# Patient Record
Sex: Male | Born: 1980 | Race: White | Hispanic: No | Marital: Single | Smoking: Never smoker
Health system: Southern US, Community
[De-identification: ages and names within clinical notes are randomized; demographics above are authoritative.]

---

## 2006-02-06 ENCOUNTER — Emergency Department (HOSPITAL_COMMUNITY): Admission: EM | Admit: 2006-02-06 | Discharge: 2006-02-07 | Payer: Self-pay | Admitting: Emergency Medicine

## 2006-03-20 ENCOUNTER — Emergency Department (HOSPITAL_COMMUNITY): Admission: EM | Admit: 2006-03-20 | Discharge: 2006-03-20 | Payer: Self-pay | Admitting: Family Medicine

## 2009-01-19 ENCOUNTER — Emergency Department (HOSPITAL_COMMUNITY): Admission: EM | Admit: 2009-01-19 | Discharge: 2009-01-19 | Payer: Self-pay | Admitting: Family Medicine

## 2011-03-28 ENCOUNTER — Inpatient Hospital Stay (INDEPENDENT_AMBULATORY_CARE_PROVIDER_SITE_OTHER)
Admission: RE | Admit: 2011-03-28 | Discharge: 2011-03-28 | Disposition: A | Discharge status: Home / Self Care | Payer: Self-pay | Source: Ambulatory Visit | Attending: Emergency Medicine | Admitting: Emergency Medicine

## 2011-03-28 DIAGNOSIS — K089 Disorder of teeth and supporting structures, unspecified: Secondary | ICD-10-CM

## 2012-03-06 ENCOUNTER — Emergency Department (INDEPENDENT_AMBULATORY_CARE_PROVIDER_SITE_OTHER)
Admission: EM | Admit: 2012-03-06 | Discharge: 2012-03-06 | Disposition: A | Discharge status: Home / Self Care | Payer: Self-pay | Source: Home / Self Care | Attending: Family Medicine | Admitting: Family Medicine

## 2012-03-06 ENCOUNTER — Encounter (HOSPITAL_COMMUNITY): Payer: Self-pay | Admitting: Emergency Medicine

## 2012-03-06 DIAGNOSIS — K089 Disorder of teeth and supporting structures, unspecified: Secondary | ICD-10-CM

## 2012-03-06 DIAGNOSIS — K0889 Other specified disorders of teeth and supporting structures: Secondary | ICD-10-CM

## 2012-03-06 MED ORDER — AMOXICILLIN 500 MG PO CAPS: 500.0000 mg | ORAL_CAPSULE | Freq: Three times a day (TID) | ORAL | Status: AC

## 2012-03-06 MED ORDER — DICLOFENAC POTASSIUM 50 MG PO TABS: 50.0000 mg | ORAL_TABLET | Freq: Three times a day (TID) | ORAL | Status: AC

## 2012-03-06 NOTE — Discharge Instructions (Signed)
Call dr Matthias Hughs [phone]  about eval of irritable bowel problem, see dentist as soon as possible.

## 2012-03-06 NOTE — ED Notes (Signed)
Tooth ache onset last night.  Left, bottom tooth pain.  Tooth has been chipped for a long time, never has bothered him until last night.  Patient has stomach issues for four months.  Bouts of diarrhea and constipation.  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

## 2012-03-06 NOTE — ED Provider Notes (Signed)
History     CSN: [id number]  Arrival date & time 03/06/12  1554   First MD Initiated Contact with Patient 03/06/12 1557      Chief Complaint  Patient presents with  . Dental Pain    (Consider location/radiation/quality/duration/timing/severity/associated sxs/prior treatment) Patient is a 31 y.o. male presenting with tooth pain. The history is provided by the patient and a parent.  Dental PainThe symptoms began yesterday. The symptoms are worsening. The symptoms are new. The symptoms occur constantly.  Additional symptoms include: jaw pain.    History reviewed. No pertinent past medical history.  History reviewed. No pertinent past surgical history.  No family history on file.  History  Substance Use Topics  . Smoking status: Never Smoker   . Smokeless tobacco: Not on file  . Alcohol Use: No      Review of Systems  Constitutional: Negative.   HENT: Positive for dental problem.     Allergies  Review of patient's allergies indicates no known allergies.  Home Medications   Current Outpatient Rx  Name Route Sig Dispense Refill  . IBUPROFEN 100 MG PO TABS Oral Take 400 mg by mouth every 6 (six) hours as needed.    . AMOXICILLIN 500 MG PO CAPS Oral Take 1 capsule (500 mg total) by mouth 3 (three) times daily. 30 capsule 0  . DICLOFENAC POTASSIUM 50 MG PO TABS Oral Take 1 tablet (50 mg total) by mouth 3 (three) times daily. 20 tablet 0    BP 119/68  Pulse 70  Temp 98.1 F (36.7 C) (Oral)  Resp 14  SpO2 100%  Physical Exam  Nursing note and vitals reviewed. Constitutional: He appears well-developed and well-nourished.  HENT:  Right Ear: External ear normal.  Left Ear: External ear normal.  Mouth/Throat: Oropharynx is clear and moist and mucous membranes are normal. Abnormal dentition. Dental caries present.      ED Course  Procedures (including critical care time)  Labs Reviewed - No data to display No results found.   1. Pain, dental       MDM           Linna Hoff, MD 03/06/12 1743

## 2012-05-27 ENCOUNTER — Encounter (HOSPITAL_COMMUNITY): Payer: Self-pay | Admitting: *Deleted

## 2012-05-27 ENCOUNTER — Emergency Department (INDEPENDENT_AMBULATORY_CARE_PROVIDER_SITE_OTHER)
Admission: EM | Admit: 2012-05-27 | Discharge: 2012-05-27 | Disposition: A | Discharge status: Home / Self Care | Payer: Self-pay | Source: Home / Self Care | Attending: Emergency Medicine | Admitting: Emergency Medicine

## 2012-05-27 DIAGNOSIS — L255 Unspecified contact dermatitis due to plants, except food: Secondary | ICD-10-CM

## 2012-05-27 DIAGNOSIS — R21 Rash and other nonspecific skin eruption: Secondary | ICD-10-CM

## 2012-05-27 MED ORDER — TRIAMCINOLONE ACETONIDE 40 MG/ML IJ SUSP
60.0000 mg | Freq: Once | INTRAMUSCULAR | Status: AC
  Administered 2012-05-27: 60 mg via INTRAMUSCULAR

## 2012-05-27 MED ORDER — TRIAMCINOLONE ACETONIDE 40 MG/ML IJ SUSP
INTRAMUSCULAR | Status: AC
  Filled 2012-05-27: qty 5

## 2012-05-27 MED ORDER — TRIAMCINOLONE ACETONIDE 0.1 % EX CREA: TOPICAL_CREAM | Freq: Two times a day (BID) | CUTANEOUS | Status: AC

## 2012-05-27 MED ORDER — METHYLPREDNISOLONE 4 MG PO KIT: PACK | ORAL | Status: AC

## 2012-05-27 NOTE — ED Provider Notes (Signed)
History     CSN: [id number]  Arrival date & time 05/27/12  1121   None     Chief Complaint  Patient presents with  . Rash    (Consider location/radiation/quality/duration/timing/severity/associated sxs/prior treatment) Patient is a 31 y.o. male presenting with rash. The history is provided by the patient.  Rash   This patient complains of a pruritic rash.  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 No pertinent past medical history.  History reviewed. No pertinent past surgical history.  No family history on file.  History  Substance Use Topics  . Smoking status: Never Smoker   . Smokeless tobacco: Not on file  . Alcohol Use: No      Review of Systems  Constitutional: Negative.   Respiratory: Negative.   Cardiovascular: Negative.   Skin: Positive for rash. Negative for color change, pallor and wound.    Allergies  Review of patient's allergies indicates no known allergies.  Home Medications   Current Outpatient Rx  Name Route Sig Dispense Refill  . DICLOFENAC POTASSIUM 50 MG PO TABS Oral Take 1 tablet (50 mg total) by mouth 3 (three) times daily. 20 tablet 0  . IBUPROFEN 100 MG PO TABS Oral Take 400 mg by mouth every 6 (six) hours as needed.    . METHYLPREDNISOLONE 4 MG PO KIT  follow package directions 21 tablet 0  . TRIAMCINOLONE ACETONIDE 0.1 % EX CREA Topical Apply topically 2 (two) times daily. 30 g 0    BP 124/74  Pulse 60  Temp 97.8 F (36.6 C) (Oral)  Resp 18  SpO2 100%  Physical  Exam  Nursing note and vitals reviewed. Constitutional: He is oriented to person, place, and time. Vital signs are normal. He appears well-developed and well-nourished. He is active and cooperative.  HENT:  Head: Normocephalic.  Eyes: Conjunctivae are normal. Pupils are equal, round, and reactive to light. No scleral icterus.  Neck: Trachea normal. Neck supple.  Cardiovascular: Normal rate and regular rhythm.   Pulmonary/Chest: Effort normal and breath sounds normal.  Neurological: He is alert and oriented to person, place, and time. No cranial nerve deficit or sensory deficit.  Skin: Skin is warm and dry. Rash noted. Rash is papular and pustular.     Psychiatric: He has a normal mood and affect. His speech is normal and behavior is normal. Judgment and thought content normal. Cognition and memory are normal.    ED Course  Procedures (including critical care time)  Labs Reviewed - No data to display No results found.   1. Contact dermatitis due to plant   2. Rash       MDM  Cool showers; avoid heat, sunlight and anything that makes condition worse.  Begin for at least seven days.  Begin Medrol dosepak tomorrow-follow instructions.  RTC if symptoms do not improve or begin to have problems swallowing, breathing or significant change in condition.  Triamcinolone for all areas excluding face.          Johnsie Kindred, NP 05/27/12 1343

## 2012-05-27 NOTE — ED Notes (Signed)
Pt  Works  As  Research scientist (life sciences)    -  He  Has  Had    Contact  Dermatitis  In past  He  Developed  A  Rash  Over the  Weekend    Neck  And  Arms  / chest    -  The  Rash  Does  Itch     - he  Is  In no  Severe   Distress        He  Displays  No  Angioedema

## 2012-05-29 NOTE — ED Provider Notes (Signed)
Medical screening examination/treatment/procedure(s) were performed by non-physician practitioner and as supervising physician I was immediately available for consultation/collaboration.  Delson Dulworth M. MD   Jestina Stephani M Burlene Montecalvo, MD 05/29/12 0814 

## 2013-02-25 ENCOUNTER — Emergency Department (INDEPENDENT_AMBULATORY_CARE_PROVIDER_SITE_OTHER)
Admission: EM | Admit: 2013-02-25 | Discharge: 2013-02-25 | Disposition: A | Discharge status: Home / Self Care | Payer: Self-pay | Source: Home / Self Care

## 2013-02-25 ENCOUNTER — Encounter (HOSPITAL_COMMUNITY): Payer: Self-pay | Admitting: Emergency Medicine

## 2013-02-25 DIAGNOSIS — D229 Melanocytic nevi, unspecified: Secondary | ICD-10-CM

## 2013-02-25 DIAGNOSIS — L237 Allergic contact dermatitis due to plants, except food: Secondary | ICD-10-CM

## 2013-02-25 DIAGNOSIS — D239 Other benign neoplasm of skin, unspecified: Secondary | ICD-10-CM

## 2013-02-25 DIAGNOSIS — L255 Unspecified contact dermatitis due to plants, except food: Secondary | ICD-10-CM

## 2013-02-25 MED ORDER — PREDNISONE 10 MG PO KIT: PACK | ORAL | Status: DC

## 2013-02-25 MED ORDER — TRIAMCINOLONE ACETONIDE 0.1 % EX CREA: TOPICAL_CREAM | Freq: Two times a day (BID) | CUTANEOUS | Status: DC

## 2013-02-25 MED ORDER — CETIRIZINE HCL 10 MG PO TABS: 10.0000 mg | ORAL_TABLET | Freq: Every day | ORAL | Status: DC

## 2013-02-25 NOTE — ED Provider Notes (Signed)
History     CSN: [id number]  Arrival date & time 02/25/13  1058   None     Chief Complaint  Patient presents with  . Poison Ivy  . Nevus    (Consider location/radiation/quality/duration/timing/severity/associated sxs/prior treatment) HPI Comments: Patient presents complaining of red, itchy rash on his arms and legs past 3 days, getting continually worse. 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 Since then the itching and the rash are both getting worse. He denies any other symptoms apart from the rash. There is no pain or discharge.  Also, the patient has a wart on the middle of his upper back that has grown painful and irritating. He states he can feel it when he sits back in a chair and he would like to have it removed. He denies any recent changes in the wart and he has no history of skin cancer   Patient is a 32 y.o. male presenting with poison ivy.  Poison Ivy Pertinent negatives include no chest pain, no abdominal pain and no shortness of breath.    History reviewed. No pertinent past medical history.  History reviewed. No pertinent past surgical history.  History reviewed. No pertinent family history.  History  Substance Use Topics  . Smoking status: Never Smoker   . Smokeless tobacco: Not on file  . Alcohol Use: No      Review of Systems  Constitutional: Negative for fever, chills and fatigue.  HENT: Negative for sore throat, neck pain and neck stiffness.   Eyes: Negative for visual disturbance.  Respiratory: Negative for cough and shortness of breath.   Cardiovascular: Negative for chest pain, palpitations and leg swelling.  Gastrointestinal: Negative for nausea, vomiting, abdominal pain, diarrhea and constipation.  Genitourinary: Negative for dysuria, urgency, frequency and hematuria.  Musculoskeletal: Negative for myalgias and arthralgias.  Skin: Positive for rash.       Wart - see HPI  Neurological: Negative for dizziness, weakness and  light-headedness.    Allergies  Review of patient's allergies indicates no known allergies.  Home Medications   Current Outpatient Rx  Name  Route  Sig  Dispense  Refill  . cetirizine (ZYRTEC) 10 MG tablet   Oral   Take 1 tablet (10 mg total) by mouth daily.   30 tablet   0   . diclofenac (CATAFLAM) 50 MG tablet   Oral   Take 1 tablet (50 mg total) by mouth 3 (three) times daily.   20 tablet   0   . ibuprofen (ADVIL,MOTRIN) 100 MG tablet   Oral   Take 400 mg by mouth every 6 (six) hours as needed.         . PredniSONE 10 MG KIT      12 day taper dose pack, use as directed   1 kit   0   . triamcinolone cream (KENALOG) 0.1 %   Topical   Apply topically 2 (two) times daily.   30 g   0   . triamcinolone cream (KENALOG) 0.1 %   Topical   Apply topically 2 (two) times daily.   85 g   0     BP 117/63  Pulse 59  Temp(Src) 98.3 F (36.8 C) (Oral)  Resp 16  SpO2 100%  Physical Exam  Constitutional: He is oriented to person, place, and time. He appears well-developed and well-nourished. No distress.  HENT:  Head: Normocephalic and atraumatic.  Eyes: EOM are normal. Pupils are equal, round, and reactive to  light.  Pulmonary/Chest: He has no wheezes.  Abdominal: Soft. There is no tenderness.  Neurological: He is oriented to person, place, and time.  Skin: Skin is warm and dry. Lesion (3 mm soft, raised, dome shaped nevus on the upper middle back without any surrounding erythema or induration.) and rash (erythematous, maculopapular rash in a linear distribution on the arms and legs) noted. Rash is maculopapular.  Psychiatric: He has a normal mood and affect. Judgment normal.    ED Course  Procedures (including critical care time)  Labs Reviewed - No data to display No results found.   1. Contact dermatitis due to poison oak   2. Nevus       MDM  We'll treat the rash with oral corticosteroids and triamcinolone cream. Also daily 10 mg  Zyrtec.  Patient referred to dermatology or back to primary care physician for management of the wart on his back. He does not currently have a primary care physician so he was given information for the adult care clinic.    Meds ordered this encounter  Medications  . PredniSONE 10 MG KIT    Sig: 12 day taper dose pack, use as directed    Dispense:  1 kit    Refill:  0  . triamcinolone cream (KENALOG) 0.1 %    Sig: Apply topically 2 (two) times daily.    Dispense:  85 g    Refill:  0  . cetirizine (ZYRTEC) 10 MG tablet    Sig: Take 1 tablet (10 mg total) by mouth daily.    Dispense:  30 tablet    Refill:  0          Graylon Good, PA-C 02/25/13 2134

## 2013-02-25 NOTE — ED Notes (Signed)
Pt c/o poison oak on both legs x 1 week. 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 Very itchy. Pt also c/o mole on back that is raised and irregular. Has been itchy and painful. Patient is alert and oriented.

## 2013-02-26 NOTE — ED Provider Notes (Signed)
Medical screening examination/treatment/procedure(s) were performed by non-physician practitioner and as supervising physician I was immediately available for consultation/collaboration.  Hani Patnode   Rhemi Balbach, MD 02/26/13 0927 

## 2013-10-14 ENCOUNTER — Emergency Department (HOSPITAL_COMMUNITY)
Admission: EM | Admit: 2013-10-14 | Discharge: 2013-10-14 | Disposition: A | Discharge status: Home / Self Care | Payer: Self-pay | Attending: Emergency Medicine | Admitting: Emergency Medicine

## 2013-10-14 ENCOUNTER — Encounter (HOSPITAL_COMMUNITY): Payer: Self-pay | Admitting: Emergency Medicine

## 2013-10-14 DIAGNOSIS — IMO0002 Reserved for concepts with insufficient information to code with codable children: Secondary | ICD-10-CM | POA: Insufficient documentation

## 2013-10-14 DIAGNOSIS — K625 Hemorrhage of anus and rectum: Secondary | ICD-10-CM | POA: Insufficient documentation

## 2013-10-14 DIAGNOSIS — Z79899 Other long term (current) drug therapy: Secondary | ICD-10-CM | POA: Insufficient documentation

## 2013-10-14 DIAGNOSIS — K6289 Other specified diseases of anus and rectum: Secondary | ICD-10-CM | POA: Insufficient documentation

## 2013-10-14 MED ORDER — HYDROCORTISONE ACETATE 25 MG RE SUPP: 25.0000 mg | Freq: Two times a day (BID) | RECTAL | Status: AC

## 2013-10-14 MED ORDER — SENNOSIDES-DOCUSATE SODIUM 8.6-50 MG PO TABS: 2.0000 | ORAL_TABLET | Freq: Every day | ORAL | Status: AC

## 2013-10-14 NOTE — ED Notes (Signed)
Pt reports constipation x7-10 days, staining with BM, pt states he noticed some blood with in his stool 2 days ago and this am he did not have any blood with in his stool. Pt states increase pain with ambulating. Pt states his last normal BM was 10 days ago. Pt reports a symptoms of IBS but never diagnosed.

## 2013-10-14 NOTE — ED Notes (Signed)
Patient is alert and orientedx4.  Patient was explained discharge instructions and they understood them with no questions.   

## 2013-10-14 NOTE — Discharge Instructions (Signed)
As discussed, your physical exam and vital signs are reassuring for the low suspicion of your condition, and from acute disease.  However, if her symptoms do not improve with the provided prescription, it is very important that you follow up with our colleague for further evaluation and management.  Return here for concerning changes in your condition.

## 2013-10-14 NOTE — ED Provider Notes (Signed)
CSN: [id number]     Arrival date & time 10/14/13  1008 History   First MD Initiated Contact with Patient 10/14/13 1113     Chief Complaint  Patient presents with  . Constipation    HPI  Patient presents with concern of rectal pain, bloody stool. Patient notes the symptoms began approximately 6 months ago, he has not seen a physician nor tried any medication for relief. That time he has had intermittent episodes of rectal pain, and recently had several episodes of bloody stool. There is no rectal bleeding when not defecating, no significant abdominal pain, no lightheadedness, no syncope.  Patient does not smoke, does not drink, has no history of IBS He has not had any rectal bleeding in 2 days.  History reviewed. No pertinent past medical history. History reviewed. No pertinent past surgical history. History reviewed. No pertinent family history. History  Substance Use Topics  . Smoking status: Never Smoker   . Smokeless tobacco: Not on file  . Alcohol Use: No    Review of Systems  Constitutional:       Per HPI, otherwise negative  HENT:       Per HPI, otherwise negative  Respiratory:       Per HPI, otherwise negative  Cardiovascular:       Per HPI, otherwise negative  Gastrointestinal: Positive for abdominal pain, constipation, blood in stool and anal bleeding. Negative for vomiting and diarrhea.  Endocrine:       Negative aside from HPI  Genitourinary:       Neg aside from HPI   Musculoskeletal:       Per HPI, otherwise negative  Skin: Negative.   Neurological: Negative for syncope and weakness.    Allergies  Review of patient's allergies indicates no known allergies.  Home Medications   Current Outpatient Rx  Name  Route  Sig  Dispense  Refill  . hydrocortisone (ANUSOL-HC) 25 MG suppository   Rectal   Place 1 suppository (25 mg total) rectally 2 (two) times daily.   12 suppository   0   . senna-docusate (SENOKOT-S) 8.6-50 MG per tablet   Oral   Take 2  tablets by mouth daily.   20 tablet   0    BP 129/72  Pulse 100  Temp(Src) 98.4 F (36.9 C) (Oral)  Resp 18  SpO2 98% Physical Exam  Nursing note and vitals reviewed. Constitutional: He is oriented to person, place, and time. He appears well-developed. No distress.  HENT:  Head: Normocephalic and atraumatic.  Eyes: Conjunctivae and EOM are normal.  Cardiovascular: Normal rate and regular rhythm.   Pulmonary/Chest: Effort normal. No stridor. No respiratory distress.  Abdominal: He exhibits no distension.  Genitourinary:  No gross rectal blood, no external hemorrhoids, no fissure, no tenderness to palpation about the external rectum  Musculoskeletal: He exhibits no edema.  Neurological: He is alert and oriented to person, place, and time.  Skin: Skin is warm and dry.  Psychiatric: He has a normal mood and affect.    ED Course  Procedures (including critical care time) Labs Review Labs Reviewed - No data to display Imaging Review No results found.  EKG Interpretation   None       MDM   1. Proctalgia    This young male presents with months of bowel habit changes, including rectal pain, rectal bleeding. Patient's vital signs are stable, he is in no distress, there is low suspicion for acute systemic pathology.  After a lengthy conversation  with the patient about medical management, specialist followup, he was appropriate for discharge.    Carmin Muskrat, MD 10/14/13 1243

## 2013-12-21 ENCOUNTER — Emergency Department (INDEPENDENT_AMBULATORY_CARE_PROVIDER_SITE_OTHER)
Admission: EM | Admit: 2013-12-21 | Discharge: 2013-12-21 | Disposition: A | Discharge status: Home / Self Care | Payer: Self-pay | Source: Home / Self Care | Attending: Emergency Medicine | Admitting: Emergency Medicine

## 2013-12-21 ENCOUNTER — Encounter (HOSPITAL_COMMUNITY): Payer: Self-pay | Admitting: Emergency Medicine

## 2013-12-21 DIAGNOSIS — J039 Acute tonsillitis, unspecified: Secondary | ICD-10-CM

## 2013-12-21 LAB — POCT RAPID STREP A: Streptococcus, Group A Screen (Direct): NEGATIVE

## 2013-12-21 MED ORDER — PENICILLIN G BENZATHINE 1200000 UNIT/2ML IM SUSP
1.2000 10*6.[IU] | Freq: Once | INTRAMUSCULAR | Status: AC
  Administered 2013-12-21: 1.2 10*6.[IU] via INTRAMUSCULAR

## 2013-12-21 MED ORDER — HYDROCODONE-ACETAMINOPHEN 7.5-500 MG/15ML PO SOLN: 15.0000 mL | Freq: Three times a day (TID) | ORAL | Status: AC | PRN

## 2013-12-21 MED ORDER — PENICILLIN G BENZATHINE 1200000 UNIT/2ML IM SUSP
INTRAMUSCULAR | Status: AC
  Filled 2013-12-21: qty 2

## 2013-12-21 NOTE — ED Notes (Signed)
At 5 min mark POCT strep test was negative.  Test was viewed again 5 minutes later and was positive.  PA notified and patient treated in department.  POCT testing will be notified of discrepancy.

## 2013-12-21 NOTE — ED Provider Notes (Signed)
CSN: [id number]     Arrival date & time 12/21/13  1807 History   First MD Initiated Contact with Patient 12/21/13 1843     Chief Complaint  Patient presents with  . Sore Throat   (Consider location/radiation/quality/duration/timing/severity/associated sxs/prior Treatment) HPI Comments: Denies fever.  Patient is a 33 y.o. male presenting with pharyngitis.  Sore Throat This is a new problem. The current episode started 2 days ago. The problem occurs constantly. The problem has been gradually worsening. The symptoms are aggravated by swallowing.    History reviewed. No pertinent past medical history. History reviewed. No pertinent past surgical history. History reviewed. No pertinent family history. History  Substance Use Topics  . Smoking status: Never Smoker   . Smokeless tobacco: Not on file  . Alcohol Use: No    Review of Systems  All other systems reviewed and are negative.    Allergies  Review of patient's allergies indicates no known allergies.  Home Medications   Current Outpatient Rx  Name  Route  Sig  Dispense  Refill  . HYDROcodone-acetaminophen (LORTAB) 7.5-500 MG/15ML solution   Oral   Take 15 mLs by mouth every 8 (eight) hours as needed for pain.   120 mL   0   . hydrocortisone (ANUSOL-HC) 25 MG suppository   Rectal   Place 1 suppository (25 mg total) rectally 2 (two) times daily.   12 suppository   0   . senna-docusate (SENOKOT-S) 8.6-50 MG per tablet   Oral   Take 2 tablets by mouth daily.   20 tablet   0    BP 130/80  Pulse 105  Temp(Src) 98.6 F (37 C) (Oral)  Resp 19  SpO2 97% Physical Exam  Nursing note and vitals reviewed. Constitutional: He is oriented to person, place, and time. He appears well-developed and well-nourished. No distress.  HENT:  Head: Normocephalic and atraumatic.  Right Ear: Hearing, tympanic membrane, external ear and ear canal normal.  Left Ear: Hearing, tympanic membrane, external ear and ear canal normal.   Nose: Nose normal.  Mouth/Throat: Uvula is midline and mucous membranes are normal. No oral lesions. No trismus in the jaw. No uvula swelling. Oropharyngeal exudate, posterior oropharyngeal edema and posterior oropharyngeal erythema present. No tonsillar abscesses.  Moderate bilateral exudative tonsillitis  Eyes: Conjunctivae are normal. Right eye exhibits no discharge. Left eye exhibits no discharge. No scleral icterus.  Neck: Normal range of motion. Neck supple.  Cardiovascular: Normal rate, regular rhythm and normal heart sounds.   Pulmonary/Chest: Effort normal and breath sounds normal. No stridor. No respiratory distress. He has no wheezes.  Musculoskeletal: Normal range of motion.  Lymphadenopathy:    He has no cervical adenopathy.  Neurological: He is alert and oriented to person, place, and time.  Skin: Skin is warm and dry. No rash noted.  Psychiatric: He has a normal mood and affect. His behavior is normal.    ED Course  Procedures (including critical care time) Labs Review Labs Reviewed  POCT RAPID STREP A ([place])   Imaging Review No results found.   MDM   1. Tonsillitis   Rapid strep positive. Patient treated with Bicillin LA 1.2 MU at [facility]. Prescribed Lortab elixir for pain. Follow up in no improvement over next 48 hours.     [place], [place] 12/21/13 2013

## 2013-12-21 NOTE — ED Notes (Signed)
Pt  Reports  Symptoms  Of  sorethroat  Pain  When  He  swalows  As   Well  As  Swollen glands  With the  Onset of  Symptoms  Being    2  Days

## 2013-12-21 NOTE — ED Provider Notes (Signed)
Medical screening examination/treatment/procedure(s) were performed by non-physician practitioner and as supervising physician I was immediately available for consultation/collaboration.  Philipp Deputy, M.D.  Harden Mo, MD 12/21/13 2121

## 2013-12-22 MED ORDER — HYDROCODONE-ACETAMINOPHEN 5-325 MG PO TABS: ORAL_TABLET | ORAL | Status: AC

## 2013-12-23 LAB — CULTURE, GROUP A STREP

## 2014-12-25 ENCOUNTER — Emergency Department (HOSPITAL_COMMUNITY): Payer: Self-pay

## 2014-12-25 ENCOUNTER — Encounter (HOSPITAL_COMMUNITY): Payer: Self-pay | Admitting: Emergency Medicine

## 2014-12-25 ENCOUNTER — Emergency Department (HOSPITAL_COMMUNITY)
Admission: EM | Admit: 2014-12-25 | Discharge: 2014-12-25 | Disposition: A | Discharge status: Home / Self Care | Payer: Self-pay | Attending: Emergency Medicine | Admitting: Emergency Medicine

## 2014-12-25 DIAGNOSIS — S00411A Abrasion of right ear, initial encounter: Secondary | ICD-10-CM | POA: Insufficient documentation

## 2014-12-25 DIAGNOSIS — W01198A Fall on same level from slipping, tripping and stumbling with subsequent striking against other object, initial encounter: Secondary | ICD-10-CM | POA: Insufficient documentation

## 2014-12-25 DIAGNOSIS — S93402A Sprain of unspecified ligament of left ankle, initial encounter: Secondary | ICD-10-CM | POA: Insufficient documentation

## 2014-12-25 DIAGNOSIS — S8011XA Contusion of right lower leg, initial encounter: Secondary | ICD-10-CM | POA: Insufficient documentation

## 2014-12-25 DIAGNOSIS — S93401A Sprain of unspecified ligament of right ankle, initial encounter: Secondary | ICD-10-CM

## 2014-12-25 DIAGNOSIS — Y9301 Activity, walking, marching and hiking: Secondary | ICD-10-CM | POA: Insufficient documentation

## 2014-12-25 DIAGNOSIS — Y92007 Garden or yard of unspecified non-institutional (private) residence as the place of occurrence of the external cause: Secondary | ICD-10-CM | POA: Insufficient documentation

## 2014-12-25 DIAGNOSIS — Y998 Other external cause status: Secondary | ICD-10-CM | POA: Insufficient documentation

## 2014-12-25 MED ORDER — NAPROXEN 500 MG PO TABS: 500.0000 mg | ORAL_TABLET | Freq: Two times a day (BID) | ORAL | Status: AC

## 2014-12-25 MED ORDER — TRAMADOL HCL 50 MG PO TABS: 50.0000 mg | ORAL_TABLET | Freq: Four times a day (QID) | ORAL | Status: AC | PRN

## 2014-12-25 NOTE — Discharge Instructions (Signed)
Ice and elevate your legs. Naprosyn for pain and inflammation. Tramadol for severe pain. ASO brace for 1-2 wks. Follow up with your doctor for recheck.    Ankle Sprain An ankle sprain is an injury to the strong, fibrous tissues (ligaments) that hold the bones of your ankle joint together.  CAUSES An ankle sprain is usually caused by a fall or by twisting your ankle. Ankle sprains most commonly occur when you step on the outer edge of your foot, and your ankle turns inward. People who participate in sports are more prone to these types of injuries.  SYMPTOMS   Pain in your ankle. The pain may be present at rest or only when you are trying to stand or walk.  Swelling.  Bruising. Bruising may develop immediately or within 1 to 2 days after your injury.  Difficulty standing or walking, particularly when turning corners or changing directions. DIAGNOSIS  Your caregiver will ask you details about your injury and perform a physical exam of your ankle to determine if you have an ankle sprain. During the physical exam, your caregiver will press on and apply pressure to specific areas of your foot and ankle. Your caregiver will try to move your ankle in certain ways. An X-ray exam may be done to be sure a bone was not broken or a ligament did not separate from one of the bones in your ankle (avulsion fracture).  TREATMENT  Certain types of braces can help stabilize your ankle. Your caregiver can make a recommendation for this. Your caregiver may recommend the use of medicine for pain. If your sprain is severe, your caregiver may refer you to a surgeon who helps to restore function to parts of your skeletal system (orthopedist) or a physical therapist. [place] ice to your injury for 1-2 days or as directed by your caregiver. Applying ice helps to reduce inflammation and pain.  Put ice in a plastic bag.  Place a towel between your skin and the bag.  Leave the ice on for 15-20  minutes at a time, every 2 hours while you are awake.  Only take over-the-counter or prescription medicines for pain, discomfort, or fever as directed by your caregiver.  Elevate your injured ankle above the level of your heart as much as possible for 2-3 days.  If your caregiver recommends crutches, use them as instructed. Gradually put weight on the affected ankle. Continue to use crutches or a cane until you can walk without feeling pain in your ankle.  If you have a plaster splint, wear the splint as directed by your caregiver. Do not rest it on anything harder than a pillow for the first 24 hours. Do not put weight on it. Do not get it wet. You may take it off to take a shower or bath.  You may have been given an elastic bandage to wear around your ankle to provide support. If the elastic bandage is too tight (you have numbness or tingling in your foot or your foot becomes cold and blue), adjust the bandage to make it comfortable.  If you have an air splint, you may blow more air into it or let air out to make it more comfortable. You may take your splint off at night and before taking a shower or bath. Wiggle your toes in the splint several times per day to decrease swelling. SEEK MEDICAL CARE IF:   You have rapidly increasing bruising or swelling.  Your toes feel  extremely cold or you lose feeling in your foot.  Your pain is not relieved with medicine. SEEK IMMEDIATE MEDICAL CARE IF:  Your toes are numb or blue.  You have severe pain that is increasing. MAKE SURE YOU:   Understand these instructions.  Will watch your condition.  Will get help right away if you are not doing well or get worse. Document Released: 09/04/2005 Document Revised: 05/29/2012 Document Reviewed: 09/16/2011 [facility] Patient Information 2015 [place], [place]. This information is not intended to replace advice given to you by your health care provider. Make sure you discuss any questions you have with your  health care provider.
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
## 2014-12-25 NOTE — ED Notes (Signed)
Pt ambulatory to room.

## 2014-12-25 NOTE — ED Notes (Signed)
Offered patient a wheel chair but he declined and insisted on walking. RN and PA informed.
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
